# Patient Record
Sex: Male | Born: 1990 | Race: White | Hispanic: No | Marital: Single | Smoking: Never smoker
Health system: Southern US, Community
[De-identification: ages and names within clinical notes are randomized; demographics above are authoritative.]

## PROBLEM LIST (undated history)

## (undated) DIAGNOSIS — S62009A Unspecified fracture of navicular [scaphoid] bone of unspecified wrist, initial encounter for closed fracture: Secondary | ICD-10-CM

## (undated) DIAGNOSIS — T07XXXA Unspecified multiple injuries, initial encounter: Secondary | ICD-10-CM

---
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
## 2009-04-13 ENCOUNTER — Ambulatory Visit (HOSPITAL_BASED_OUTPATIENT_CLINIC_OR_DEPARTMENT_OTHER): Admission: RE | Admit: 2009-04-13 | Discharge: 2009-04-13 | Payer: Self-pay | Admitting: Orthopedic Surgery

## 2009-04-13 HISTORY — PX: ORIF SCAPHOID FRACTURE: SHX2130

## 2010-09-29 LAB — POCT HEMOGLOBIN-HEMACUE: Hemoglobin: 12.6 g/dL — ABNORMAL LOW (ref 13.0–17.0)

## 2010-12-30 ENCOUNTER — Encounter: Payer: Self-pay | Admitting: Internal Medicine

## 2010-12-30 ENCOUNTER — Ambulatory Visit (INDEPENDENT_AMBULATORY_CARE_PROVIDER_SITE_OTHER): Payer: BC Managed Care – PPO | Admitting: Internal Medicine

## 2010-12-30 DIAGNOSIS — F329 Major depressive disorder, single episode, unspecified: Secondary | ICD-10-CM

## 2010-12-30 DIAGNOSIS — G4700 Insomnia, unspecified: Secondary | ICD-10-CM

## 2010-12-30 DIAGNOSIS — R3989 Other symptoms and signs involving the genitourinary system: Secondary | ICD-10-CM

## 2010-12-30 DIAGNOSIS — F3289 Other specified depressive episodes: Secondary | ICD-10-CM

## 2010-12-30 DIAGNOSIS — F32A Depression, unspecified: Secondary | ICD-10-CM

## 2010-12-30 DIAGNOSIS — R5383 Other fatigue: Secondary | ICD-10-CM

## 2010-12-30 DIAGNOSIS — R5381 Other malaise: Secondary | ICD-10-CM

## 2010-12-30 DIAGNOSIS — R39198 Other difficulties with micturition: Secondary | ICD-10-CM

## 2010-12-30 LAB — POCT URINALYSIS DIPSTICK
Bilirubin, UA: NEGATIVE
Blood, UA: NEGATIVE
Glucose, UA: NEGATIVE
Ketones, UA: NEGATIVE
Leukocytes, UA: NEGATIVE
Nitrite, UA: NEGATIVE
Protein, UA: NEGATIVE
Spec Grav, UA: 1.005
Urobilinogen, UA: 0.2
pH, UA: 1.015

## 2010-12-30 LAB — CBC WITH DIFFERENTIAL/PLATELET
Basophils Absolute: 0 10*3/uL (ref 0.0–0.1)
Basophils Relative: 0.7 % (ref 0.0–3.0)
Eosinophils Absolute: 0.1 10*3/uL (ref 0.0–0.7)
Eosinophils Relative: 2 % (ref 0.0–5.0)
HCT: 44.7 % (ref 39.0–52.0)
Hemoglobin: 15.2 g/dL (ref 13.0–17.0)
Lymphocytes Relative: 29.8 % (ref 12.0–46.0)
Lymphs Abs: 1.8 10*3/uL (ref 0.7–4.0)
MCHC: 34.1 g/dL (ref 30.0–36.0)
MCV: 92.4 fl (ref 78.0–100.0)
Monocytes Absolute: 0.6 10*3/uL (ref 0.1–1.0)
Monocytes Relative: 9.8 % (ref 3.0–12.0)
Neutro Abs: 3.5 10*3/uL (ref 1.4–7.7)
Neutrophils Relative %: 57.7 % (ref 43.0–77.0)
Platelets: 176 10*3/uL (ref 150.0–400.0)
RBC: 4.83 Mil/uL (ref 4.22–5.81)
RDW: 12.3 % (ref 11.5–14.6)
WBC: 6.1 10*3/uL (ref 4.5–10.5)

## 2010-12-30 LAB — COMPREHENSIVE METABOLIC PANEL
ALT: 24 U/L (ref 0–53)
AST: 20 U/L (ref 0–37)
Albumin: 4.8 g/dL (ref 3.5–5.2)
Alkaline Phosphatase: 55 U/L (ref 39–117)
BUN: 9 mg/dL (ref 6–23)
CO2: 33 mEq/L — ABNORMAL HIGH (ref 19–32)
Calcium: 9.5 mg/dL (ref 8.4–10.5)
Chloride: 103 mEq/L (ref 96–112)
Creatinine, Ser: 0.9 mg/dL (ref 0.4–1.5)
GFR: 106.68 mL/min (ref >60.00)
Glucose, Bld: 81 mg/dL (ref 70–99)
Potassium: 3.8 mEq/L (ref 3.5–5.1)
Sodium: 142 mEq/L (ref 135–145)
Total Bilirubin: 0.9 mg/dL (ref 0.3–1.2)
Total Protein: 7.2 g/dL (ref 6.0–8.3)

## 2010-12-30 LAB — TSH: TSH: 1.71 u[IU]/mL (ref 0.35–5.50)

## 2010-12-30 MED ORDER — ZOLPIDEM TARTRATE 5 MG PO TABS: 5.0000 mg | ORAL_TABLET | Freq: Every evening | ORAL | Status: DC | PRN

## 2010-12-30 NOTE — Assessment & Plan Note (Addendum)
Problems w/ sleep patern as described above: rec to avoid naps Needs to go to sleep at the same time every night List of tips of healthy sleep habits provided Small dose ambien 5mg  qhs , #30, no Rf to be use as needed if can't fall sleep or if wakes up and can't fall sleep again; s/e discussed, printed material from UTD provided; knows effects will last ~ 6 to 7 hours  Patient to call if problems or if he is not improving

## 2010-12-30 NOTE — Assessment & Plan Note (Addendum)
History of depression, diagnosed approximately 2009, took  medications for a year, off medicines for more than one year. Currently asymptomatic, see history of present illness.

## 2010-12-30 NOTE — Assessment & Plan Note (Signed)
Chronic issue (years) Udip neg rec formal urologic eval (pt will discuss w/ his father who is anesthesiologist here in town)

## 2010-12-30 NOTE — Progress Notes (Signed)
  Subjective:    Patient ID: Don [name], male    DOB: Feb 11, 1991, 20 y.o.   MRN: [id number]  HPI New patient His chief complaint is difficulty with sleeping. During the summer he goes to school Monday through Thursday and works mostly on the weekends. Works  from 6 AM to 7 PM, he comes back home very tired, occasionally takes a nap, then he is unable to sleep the rest of the night. He also has a history of depression, off medications for about 1 to 1.5  years, denies to me any feeling of sadness or depression per se. He does feel fatigued and tired, lost his desire  to exercise. He thinks fatigue is related from the lack of a good sleep pattern  Past Medical History  Diagnosis Date  . Depression     Past Surgical History  Procedure Date  . Scaphoid fracture surgery     R    Family History  Problem Relation Age of Onset  . Alcohol abuse Brother   . Colon cancer      MGGF  . Stroke Maternal Grandmother   . Stroke Maternal Grandfather   . Hypertension    . Depression      PGM,aunts,uncles  . Diabetes      cousin    History   Social History  . Marital Status: Single    Spouse Name: N/A    Number of Children: N/A  . Years of Education: N/A   Occupational History  . student and work at [employer]    Social History Main Topics  . Smoking status: Never Smoker   . Smokeless tobacco: Not on file  . Alcohol Use: No  . Drug Use: No     nothing in years   . Sexually Active: Not on file   Other Topics Concern  . Not on file   Social History Narrative   Lives w/ parents-- goes to [employer] college     Review of Systems Denies snoring No nausea, vomiting diarrhea No ED issues or decreased libido Denies suicidal ideas or feeling unmotivated (except for exercise). For years, since  age 23 or 21, has noted occasional difficulty with urination. His urine is described as dilute I. he is thirsty a lot. No recent change in his weight.     Objective:   Physical Exam    Constitutional: He is oriented to person, place, and time. He appears well-developed and well-nourished. No distress.  HENT:  Head: Normocephalic and atraumatic.  Neck: No thyromegaly present.  Cardiovascular: Normal rate, regular rhythm and normal heart sounds.   No murmur heard. Pulmonary/Chest: Effort normal and breath sounds normal. No respiratory distress. He has no wheezes. He has no rales.  Abdominal: Soft. Bowel sounds are normal. He exhibits no distension. There is no tenderness.  Musculoskeletal: He exhibits no edema.  Neurological: He is alert and oriented to person, place, and time.  Skin: He is not diaphoretic.  Psychiatric: He has a normal mood and affect. His behavior is normal. Judgment and thought content normal.          Assessment & Plan:

## 2010-12-30 NOTE — Assessment & Plan Note (Signed)
Suspect he will feel better once sleep patern improves Basic labs to r/o problems like anemia DM

## 2011-01-03 ENCOUNTER — Telehealth: Payer: Self-pay | Admitting: Internal Medicine

## 2011-01-04 ENCOUNTER — Telehealth: Payer: Self-pay | Admitting: *Deleted

## 2011-01-04 NOTE — Telephone Encounter (Signed)
Message copied by Leanne Lovely on Wed Jan 04, 2011 11:49 AM ------      Message from: Wanda Plump      Created: Tue Jan 03, 2011  5:09 PM       Advise patient:      Labs wnl

## 2011-01-04 NOTE — Telephone Encounter (Signed)
Message left for patient to return my call on cell, no answer on home phone.

## 2011-01-05 NOTE — Telephone Encounter (Signed)
Pt is aware.  

## 2011-01-08 NOTE — Telephone Encounter (Signed)
error 

## 2011-06-12 ENCOUNTER — Emergency Department (HOSPITAL_COMMUNITY)
Admission: EM | Admit: 2011-06-12 | Discharge: 2011-06-13 | Disposition: A | Discharge status: Home / Self Care | Payer: BC Managed Care – PPO | Attending: Emergency Medicine | Admitting: Emergency Medicine

## 2011-06-12 ENCOUNTER — Encounter (HOSPITAL_COMMUNITY): Payer: Self-pay | Admitting: Emergency Medicine

## 2011-06-12 DIAGNOSIS — IMO0001 Reserved for inherently not codable concepts without codable children: Secondary | ICD-10-CM | POA: Insufficient documentation

## 2011-06-12 DIAGNOSIS — IMO0002 Reserved for concepts with insufficient information to code with codable children: Secondary | ICD-10-CM | POA: Insufficient documentation

## 2011-06-12 DIAGNOSIS — Z79899 Other long term (current) drug therapy: Secondary | ICD-10-CM | POA: Insufficient documentation

## 2011-06-12 MED ORDER — AMOXICILLIN-POT CLAVULANATE 875-125 MG PO TABS: 1.0000 | ORAL_TABLET | Freq: Two times a day (BID) | ORAL | Status: AC

## 2011-06-12 MED ORDER — AMOXICILLIN-POT CLAVULANATE 875-125 MG PO TABS
1.0000 | ORAL_TABLET | ORAL | Status: AC
  Administered 2011-06-13: 1 via ORAL
  Filled 2011-06-12: qty 1

## 2011-06-12 MED ORDER — AMOXICILLIN-POT CLAVULANATE 875-125 MG PO TABS: 1.0000 | ORAL_TABLET | Freq: Once | ORAL | Status: DC

## 2011-06-12 MED ORDER — TETANUS-DIPHTH-ACELL PERTUSSIS 5-2.5-18.5 LF-MCG/0.5 IM SUSP
0.5000 mL | Freq: Once | INTRAMUSCULAR | Status: AC
  Administered 2011-06-13: 0.5 mL via INTRAMUSCULAR
  Filled 2011-06-12 (×2): qty 0.5
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
## 2011-06-12 NOTE — ED Notes (Signed)
Pt c/o cat bite to left 2nd digit; pt sts was petting cat and bit him; small scratch noted; pt sts unknown vaccine hx of cat

## 2011-06-12 NOTE — ED Notes (Signed)
Patient states he was playing with neighborhood cat. States the cat didn't break skin just scratched him. Concerned with infection

## 2011-06-13 NOTE — ED Provider Notes (Signed)
Medical screening examination/treatment/procedure(s) were performed by non-physician practitioner and as supervising physician I was immediately available for consultation/collaboration.  Raeford Razor, MD 06/13/11 2227

## 2011-10-27 ENCOUNTER — Encounter (HOSPITAL_COMMUNITY): Payer: Self-pay

## 2011-10-27 ENCOUNTER — Emergency Department (INDEPENDENT_AMBULATORY_CARE_PROVIDER_SITE_OTHER)
Admission: EM | Admit: 2011-10-27 | Discharge: 2011-10-27 | Disposition: A | Discharge status: Home / Self Care | Payer: BC Managed Care – PPO | Source: Home / Self Care | Attending: Emergency Medicine | Admitting: Emergency Medicine

## 2011-10-27 DIAGNOSIS — X19XXXA Contact with other heat and hot substances, initial encounter: Secondary | ICD-10-CM

## 2011-10-27 DIAGNOSIS — T23209A Burn of second degree of unspecified hand, unspecified site, initial encounter: Secondary | ICD-10-CM

## 2011-10-27 DIAGNOSIS — T23201A Burn of second degree of right hand, unspecified site, initial encounter: Secondary | ICD-10-CM

## 2011-10-27 MED ORDER — OXYCODONE-ACETAMINOPHEN 5-325 MG PO TABS: ORAL_TABLET | ORAL | Status: AC

## 2011-10-27 MED ORDER — SILVER SULFADIAZINE 1 % EX CREA: TOPICAL_CREAM | Freq: Every day | CUTANEOUS | Status: DC

## 2011-10-27 NOTE — Discharge Instructions (Signed)
Change dressing daily, wash with soap and water, pat dry, apply Silvadene, Telfa dressing, and some roll gauze.  Return in 3 days for recheck.
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
## 2011-10-31 ENCOUNTER — Emergency Department (INDEPENDENT_AMBULATORY_CARE_PROVIDER_SITE_OTHER)
Admission: EM | Admit: 2011-10-31 | Discharge: 2011-10-31 | Disposition: A | Discharge status: Home / Self Care | Payer: BC Managed Care – PPO | Source: Home / Self Care | Attending: Family Medicine | Admitting: Family Medicine

## 2011-10-31 ENCOUNTER — Encounter (HOSPITAL_COMMUNITY): Payer: Self-pay | Admitting: *Deleted

## 2011-10-31 DIAGNOSIS — T23209A Burn of second degree of unspecified hand, unspecified site, initial encounter: Secondary | ICD-10-CM

## 2011-10-31 DIAGNOSIS — X19XXXA Contact with other heat and hot substances, initial encounter: Secondary | ICD-10-CM

## 2011-10-31 DIAGNOSIS — Z09 Encounter for follow-up examination after completed treatment for conditions other than malignant neoplasm: Secondary | ICD-10-CM
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
## 2011-10-31 NOTE — Discharge Instructions (Signed)
Care as discussed, wash regularly, use cream until dry, return as needed.
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
## 2012-02-06 ENCOUNTER — Emergency Department (INDEPENDENT_AMBULATORY_CARE_PROVIDER_SITE_OTHER)
Admission: EM | Admit: 2012-02-06 | Discharge: 2012-02-06 | Disposition: A | Discharge status: Home / Self Care | Payer: BC Managed Care – PPO | Source: Home / Self Care | Attending: Emergency Medicine | Admitting: Emergency Medicine

## 2012-02-06 ENCOUNTER — Telehealth: Payer: Self-pay | Admitting: Internal Medicine

## 2012-02-06 ENCOUNTER — Encounter (HOSPITAL_COMMUNITY): Payer: Self-pay | Admitting: *Deleted

## 2012-02-06 DIAGNOSIS — A779 Spotted fever, unspecified: Secondary | ICD-10-CM

## 2012-02-06 DIAGNOSIS — A770 Spotted fever due to Rickettsia rickettsii: Secondary | ICD-10-CM

## 2012-02-06 LAB — POCT I-STAT, CHEM 8
BUN: 11 mg/dL (ref 6–23)
Calcium, Ion: 1.2 mmol/L (ref 1.12–1.23)
Creatinine, Ser: 1.2 mg/dL (ref 0.50–1.35)
TCO2: 23 mmol/L (ref 0–100)

## 2012-02-06 LAB — CBC WITH DIFFERENTIAL/PLATELET
Eosinophils Relative: 0 % (ref 0–5)
HCT: 45 % (ref 39.0–52.0)
Hemoglobin: 16.2 g/dL (ref 13.0–17.0)
Lymphocytes Relative: 10 % — ABNORMAL LOW (ref 12–46)
Lymphs Abs: 0.8 10*3/uL (ref 0.7–4.0)
MCV: 85.6 fL (ref 78.0–100.0)
Monocytes Absolute: 0.9 10*3/uL (ref 0.1–1.0)
Monocytes Relative: 12 % (ref 3–12)
RBC: 5.26 MIL/uL (ref 4.22–5.81)
WBC: 7.7 10*3/uL (ref 4.0–10.5)

## 2012-02-06 MED ORDER — DOXYCYCLINE HYCLATE 100 MG PO TABS: 100.0000 mg | ORAL_TABLET | Freq: Two times a day (BID) | ORAL | Status: AC
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
## 2012-02-06 NOTE — Telephone Encounter (Signed)
Check on pt in am.

## 2012-02-06 NOTE — ED Provider Notes (Signed)
Chief Complaint  Patient presents with  . Fever    History of Present Illness:   Don [name] is a 21 year old male who was bitten by a tick on the left ankle on Monday, August 5, 9 days ago. He was in [place] when the tick bite occurred. There was a halo of erythema around the tick bite for about 3 days. The tick was a normal-size  wood tick. He did well up until last night when he felt hot and noted muscle aches. This morning he had a headache, a temperature of 100.3 and felt nauseated. He denies any muscle aching today. He's had no other symptoms including generalized skin rash, nasal congestion, rhinorrhea, sore throat, stiff neck, adenopathy, coughing, chest pain, shortness of breath, abdominal pain, vomiting, diarrhea, or urinary symptoms.  Review of Systems:  Other than noted above, the patient denies any of the following symptoms. Systemic:  No fever, chills, sweats, fatigue, myalgias, headache, or anorexia. Eye:  No redness, pain or drainage. ENT:  No earache, nasal congestion, rhinorrhea, sinus pressure, or sore throat. Lungs:  No cough, sputum production, wheezing, shortness of breath.  Cardiovascular:  No chest pain, palpitations, or syncope. GI:  No nausea, vomiting, abdominal pain or diarrhea. GU:  No dysuria, frequency, or hematuria. Skin:  No rash or pruritis.  PMFSH:  Past medical history, family history, social history, meds, and allergies were reviewed.   Physical Exam:   Vital signs:  BP 116/75  Pulse 79  Temp 97.9 F (36.6 C) (Oral)  Resp 16  SpO2 99% General:  Alert, in no distress. Eye:  PERRL, full EOMs.  Lids and conjunctivas were normal. ENT:  TMs and canals were normal, without erythema or inflammation.  Nasal mucosa was clear and uncongested, without drainage.  Mucous membranes were moist.  Pharynx was clear, without exudate or drainage.  There were no oral ulcerations or lesions. Neck:  Supple, no adenopathy, tenderness or mass. Thyroid was normal. Lungs:  No  respiratory distress.  Lungs were clear to auscultation, without wheezes, rales or rhonchi.  Breath sounds were clear and equal bilaterally. Heart:  Regular rhythm, without gallops, murmers or rubs. Abdomen:  Soft, flat, and non-tender to palpation.  No hepatosplenomagaly or mass. Skin:  Clear, warm, and dry, without rash or lesions. The area of the tick bite right now is just a tiny scabbed over area measuring no more than a millimeter in size with no surrounding erythema or tenderness. He has no rash on his wrists or ankles.  Labs:   Results for orders placed during the hospital encounter of 02/06/12  CBC WITH DIFFERENTIAL      Component Value Range   WBC 7.7  4.0 - 10.5 K/uL   RBC 5.26  4.22 - 5.81 MIL/uL   Hemoglobin 16.2  13.0 - 17.0 g/dL   HCT 16.1  09.6 - 04.5 %   MCV 85.6  78.0 - 100.0 fL   MCH 30.8  26.0 - 34.0 pg   MCHC 36.0  30.0 - 36.0 g/dL   RDW 40.9 (*) 81.1 - 91.4 %   Platelets 152  150 - 400 K/uL   Neutrophils Relative 77  43 - 77 %   Neutro Abs 6.0  1.7 - 7.7 K/uL   Lymphocytes Relative 10 (*) 12 - 46 %   Lymphs Abs 0.8  0.7 - 4.0 K/uL   Monocytes Relative 12  3 - 12 %   Monocytes Absolute 0.9  0.1 - 1.0 K/uL   Eosinophils  Relative 0  0 - 5 %   Eosinophils Absolute 0.0  0.0 - 0.7 K/uL   Basophils Relative 1  0 - 1 %   Basophils Absolute 0.0  0.0 - 0.1 K/uL  POCT I-STAT, CHEM 8      Component Value Range   Sodium 141  135 - 145 mEq/L   Potassium 3.6  3.5 - 5.1 mEq/L   Chloride 104  96 - 112 mEq/L   BUN 11  6 - 23 mg/dL   Creatinine, Ser 1.61  0.50 - 1.35 mg/dL   Glucose, Bld 97  70 - 99 mg/dL   Calcium, Ion 0.96  1.12 - 1.23 mmol/L   TCO2 23  0 - 100 mmol/L   Hemoglobin 16.3  13.0 - 17.0 g/dL   HCT 04.5  40.9 - 81.1 %    Other Labs Obtained at Urgent Care Center:  [facility] spotted fever IgM antibody was obtained.  Results are pending at this time and we will call about any positive results.  Assessment:  The encounter diagnosis was [facility] spotted  fever.  Plan:   1.  The following meds were prescribed:   New Prescriptions   DOXYCYCLINE (VIBRA-TABS) 100 MG TABLET    Take 1 tablet (100 mg total) by mouth 2 (two) times daily.   2.  The patient was instructed in symptomatic care and handouts were given. 3.  The patient was told to return if becoming worse in any way, if no better in 3 or 4 days, and given some red flag symptoms that would indicate earlier return.  Follow up:  The patient was told to follow up here in 48 hours for recheck. He was told to go directly to the emergency room he should have trouble keeping the antibiotic down the stomach or if he feels like he's getting worse.     Reuben Likes, MD 02/06/12 2032

## 2012-02-06 NOTE — ED Notes (Signed)
Pt reports tick bite one week ago on lower left ankle that was red and had circular reaction around it. Swelling and redness has since went down. Pt reports waking this morning with fever, headache and nausea.

## 2012-02-07 ENCOUNTER — Telehealth (HOSPITAL_COMMUNITY): Payer: Self-pay | Admitting: *Deleted

## 2012-02-07 NOTE — Telephone Encounter (Signed)
Patient was seen in UC yesterday.    KP

## 2012-02-07 NOTE — ED Notes (Signed)
Lab called to report critical RMSF value.  IGM - 1.38.  Will notify RN and MD

## 2012-02-07 NOTE — Telephone Encounter (Signed)
Great!

## 2012-02-07 NOTE — ED Notes (Signed)
Lab called with RMSF IgM result 1.38 H.  Lab shown to Dr. Ladon Applebaum.  He said to call for clinical improvement, tell to pt. finish the antibiotics, if worsening go to the ED. I called pt. Pt. verified x 2 and given results. Pt. told he has RMSF.  Pt. said he is feeling better today, still has H/A, nausea, no appetite, and feels tired.  Took a nap today.  Pt. Given Dr. Rigoberto Noel instructions and voiced understanding. He said he was told to come back tomorrow for a recheck. I told him that he should still do that. Vassie Moselle 02/07/2012

## 2012-02-08 NOTE — ED Notes (Signed)
Dr. Lorenz Coaster notified of results. I told him that I had talked to Don [name]. yesterday and what Don [name]. said. I said, Don [name]. was told to come back today for a recheck.

## 2012-02-28 ENCOUNTER — Telehealth: Payer: Self-pay | Admitting: *Deleted

## 2012-02-28 NOTE — Telephone Encounter (Signed)
Left msg for pt to call off to schedule a follow-up appt for [facility] spotted fever with Dr. Drue Novel.

## 2012-03-07 ENCOUNTER — Encounter (HOSPITAL_COMMUNITY): Payer: Self-pay | Admitting: *Deleted

## 2012-03-07 ENCOUNTER — Emergency Department (HOSPITAL_COMMUNITY)
Admission: EM | Admit: 2012-03-07 | Discharge: 2012-03-08 | Disposition: A | Discharge status: Home / Self Care | Payer: BC Managed Care – PPO | Attending: Emergency Medicine | Admitting: Emergency Medicine

## 2012-03-07 ENCOUNTER — Emergency Department (HOSPITAL_COMMUNITY): Payer: BC Managed Care – PPO

## 2012-03-07 DIAGNOSIS — IMO0002 Reserved for concepts with insufficient information to code with codable children: Secondary | ICD-10-CM | POA: Insufficient documentation

## 2012-03-07 DIAGNOSIS — S060XAA Concussion with loss of consciousness status unknown, initial encounter: Secondary | ICD-10-CM | POA: Insufficient documentation

## 2012-03-07 DIAGNOSIS — H5789 Other specified disorders of eye and adnexa: Secondary | ICD-10-CM | POA: Insufficient documentation

## 2012-03-07 DIAGNOSIS — T07XXXA Unspecified multiple injuries, initial encounter: Secondary | ICD-10-CM

## 2012-03-07 DIAGNOSIS — S060X9A Concussion with loss of consciousness of unspecified duration, initial encounter: Secondary | ICD-10-CM | POA: Insufficient documentation

## 2012-03-07 MED ORDER — IBUPROFEN 800 MG PO TABS
800.0000 mg | ORAL_TABLET | Freq: Once | ORAL | Status: AC
  Administered 2012-03-07: 800 mg via ORAL
  Filled 2012-03-07: qty 1

## 2012-03-07 MED ORDER — TRAMADOL HCL 50 MG PO TABS: 50.0000 mg | ORAL_TABLET | Freq: Four times a day (QID) | ORAL | Status: AC | PRN

## 2012-03-07 NOTE — ED Notes (Signed)
PA at bedside.

## 2012-03-07 NOTE — ED Notes (Signed)
Returned from CT.

## 2012-03-07 NOTE — ED Notes (Signed)
Pt ambulatory to [facility]
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
## 2012-03-08 DIAGNOSIS — S62009A Unspecified fracture of navicular [scaphoid] bone of unspecified wrist, initial encounter for closed fracture: Secondary | ICD-10-CM

## 2012-03-08 DIAGNOSIS — T07XXXA Unspecified multiple injuries, initial encounter: Secondary | ICD-10-CM

## 2012-03-08 HISTORY — DX: Unspecified multiple injuries, initial encounter: T07.XXXA

## 2012-03-08 HISTORY — DX: Unspecified fracture of navicular (scaphoid) bone of unspecified wrist, initial encounter for closed fracture: S62.009A

## 2012-03-13 ENCOUNTER — Ambulatory Visit (INDEPENDENT_AMBULATORY_CARE_PROVIDER_SITE_OTHER)
Admission: RE | Admit: 2012-03-13 | Discharge: 2012-03-13 | Disposition: A | Discharge status: Home / Self Care | Payer: BC Managed Care – PPO | Source: Ambulatory Visit | Attending: Internal Medicine | Admitting: Internal Medicine

## 2012-03-13 ENCOUNTER — Encounter: Payer: Self-pay | Admitting: Internal Medicine

## 2012-03-13 ENCOUNTER — Encounter (HOSPITAL_COMMUNITY): Payer: Self-pay | Admitting: Anesthesiology

## 2012-03-13 ENCOUNTER — Ambulatory Visit (INDEPENDENT_AMBULATORY_CARE_PROVIDER_SITE_OTHER): Payer: BC Managed Care – PPO | Admitting: Internal Medicine

## 2012-03-13 VITALS — BP 108/72 | HR 55 | Temp 97.5°F | Wt 163.0 lb

## 2012-03-13 DIAGNOSIS — M25539 Pain in unspecified wrist: Secondary | ICD-10-CM

## 2012-03-13 DIAGNOSIS — S62109A Fracture of unspecified carpal bone, unspecified wrist, initial encounter for closed fracture: Secondary | ICD-10-CM

## 2012-03-13 DIAGNOSIS — S060X9A Concussion with loss of consciousness of unspecified duration, initial encounter: Secondary | ICD-10-CM

## 2012-03-13 NOTE — ED Provider Notes (Signed)
Medical screening examination/treatment/procedure(s) were performed by non-physician practitioner and as supervising physician I was immediately available for consultation/collaboration.  Tracen Mahler, MD 03/13/12 0027 

## 2012-03-13 NOTE — Patient Instructions (Addendum)
Please get your x-ray at the other [facility]  office located at: [address] [place], across from [facility].  Please go to the basement, this is a walk-in facility, they are open from 8:30 to 5:30 PM. Phone number [phone]. ---- Call if the wrist pain continue beyond 2 weeks Call anytime if you develop a headache, neck pain, nausea or vomiting.
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
## 2012-03-14 ENCOUNTER — Telehealth: Payer: Self-pay | Admitting: Internal Medicine

## 2012-03-14 DIAGNOSIS — S62109A Fracture of unspecified carpal bone, unspecified wrist, initial encounter for closed fracture: Secondary | ICD-10-CM

## 2012-03-14 NOTE — Telephone Encounter (Signed)
Discussed with pt, entered referral.  

## 2012-03-14 NOTE — Telephone Encounter (Signed)
Pt left vm on triage line requesting the results from the xray on his wrist. Call back # [phone]

## 2012-03-21 ENCOUNTER — Encounter (HOSPITAL_BASED_OUTPATIENT_CLINIC_OR_DEPARTMENT_OTHER): Payer: Self-pay | Admitting: *Deleted

## 2012-03-22 ENCOUNTER — Other Ambulatory Visit: Payer: Self-pay | Admitting: Orthopedic Surgery

## 2012-03-26 ENCOUNTER — Encounter (HOSPITAL_BASED_OUTPATIENT_CLINIC_OR_DEPARTMENT_OTHER): Payer: Self-pay | Admitting: Orthopedic Surgery

## 2012-03-26 ENCOUNTER — Ambulatory Visit (HOSPITAL_BASED_OUTPATIENT_CLINIC_OR_DEPARTMENT_OTHER)
Admission: RE | Admit: 2012-03-26 | Discharge: 2012-03-26 | Disposition: A | Discharge status: Home / Self Care | Payer: BC Managed Care – PPO | Source: Ambulatory Visit | Attending: Orthopedic Surgery | Admitting: Orthopedic Surgery

## 2012-03-26 ENCOUNTER — Encounter (HOSPITAL_BASED_OUTPATIENT_CLINIC_OR_DEPARTMENT_OTHER): Payer: Self-pay | Admitting: Anesthesiology

## 2012-03-26 ENCOUNTER — Encounter (HOSPITAL_BASED_OUTPATIENT_CLINIC_OR_DEPARTMENT_OTHER)
Admission: RE | Disposition: A | Discharge status: Home / Self Care | Payer: Self-pay | Source: Ambulatory Visit | Attending: Orthopedic Surgery

## 2012-03-26 ENCOUNTER — Ambulatory Visit (HOSPITAL_BASED_OUTPATIENT_CLINIC_OR_DEPARTMENT_OTHER): Payer: BC Managed Care – PPO | Admitting: Anesthesiology

## 2012-03-26 DIAGNOSIS — S62009A Unspecified fracture of navicular [scaphoid] bone of unspecified wrist, initial encounter for closed fracture: Secondary | ICD-10-CM | POA: Insufficient documentation

## 2012-03-26 HISTORY — DX: Unspecified fracture of navicular (scaphoid) bone of unspecified wrist, initial encounter for closed fracture: S62.009A

## 2012-03-26 HISTORY — DX: Unspecified multiple injuries, initial encounter: T07.XXXA

## 2012-03-26 HISTORY — PX: ORIF SCAPHOID FRACTURE: SHX2130

## 2012-03-26 LAB — POCT HEMOGLOBIN-HEMACUE: Hemoglobin: 14 g/dL (ref 13.0–17.0)

## 2012-03-26 SURGERY — OPEN REDUCTION INTERNAL FIXATION (ORIF) SCAPHOID FRACTURE
Anesthesia: General | Site: Hand | Laterality: Left | Wound class: Clean

## 2012-03-26 MED ORDER — ONDANSETRON HCL 4 MG/2ML IJ SOLN: 4.0000 mg | Freq: Four times a day (QID) | INTRAMUSCULAR | Status: DC | PRN

## 2012-03-26 MED ORDER — CEFAZOLIN SODIUM-DEXTROSE 2-3 GM-% IV SOLR
2.0000 g | INTRAVENOUS | Status: AC
  Administered 2012-03-26: 2 g via INTRAVENOUS

## 2012-03-26 MED ORDER — LACTATED RINGERS IV SOLN
INTRAVENOUS | Status: DC | PRN
  Administered 2012-03-26 (×2): via INTRAVENOUS

## 2012-03-26 MED ORDER — MIDAZOLAM HCL 2 MG/2ML IJ SOLN: 1.0000 mg | INTRAMUSCULAR | Status: DC | PRN

## 2012-03-26 MED ORDER — FENTANYL CITRATE 0.05 MG/ML IJ SOLN
INTRAMUSCULAR | Status: DC | PRN
  Administered 2012-03-26 (×2): 50 ug via INTRAVENOUS

## 2012-03-26 MED ORDER — CHLORHEXIDINE GLUCONATE 4 % EX LIQD: 60.0000 mL | Freq: Once | CUTANEOUS | Status: DC

## 2012-03-26 MED ORDER — LACTATED RINGERS IV SOLN
INTRAVENOUS | Status: DC
  Administered 2012-03-26: 10:00:00 via INTRAVENOUS

## 2012-03-26 MED ORDER — HYDROMORPHONE HCL PF 1 MG/ML IJ SOLN
0.2500 mg | INTRAMUSCULAR | Status: DC | PRN
  Administered 2012-03-26 (×3): 0.5 mg via INTRAVENOUS

## 2012-03-26 MED ORDER — ONDANSETRON HCL 4 MG/2ML IJ SOLN
INTRAMUSCULAR | Status: DC | PRN
  Administered 2012-03-26: 4 mg via INTRAVENOUS

## 2012-03-26 MED ORDER — DEXAMETHASONE SODIUM PHOSPHATE 4 MG/ML IJ SOLN
INTRAMUSCULAR | Status: DC | PRN
  Administered 2012-03-26: 10 mg via INTRAVENOUS

## 2012-03-26 MED ORDER — BUPIVACAINE HCL (PF) 0.25 % IJ SOLN
INTRAMUSCULAR | Status: DC | PRN
  Administered 2012-03-26: 2 mL

## 2012-03-26 MED ORDER — FENTANYL CITRATE 0.05 MG/ML IJ SOLN: 50.0000 ug | INTRAMUSCULAR | Status: DC | PRN

## 2012-03-26 MED ORDER — OXYCODONE-ACETAMINOPHEN 7.5-325 MG PO TABS: 1.0000 | ORAL_TABLET | ORAL | Status: DC | PRN

## 2012-03-26 MED ORDER — MEPERIDINE HCL 25 MG/ML IJ SOLN
6.2500 mg | INTRAMUSCULAR | Status: DC | PRN
  Administered 2012-03-26: 12.5 mg via INTRAVENOUS

## 2012-03-26 MED ORDER — MIDAZOLAM HCL 5 MG/5ML IJ SOLN
INTRAMUSCULAR | Status: DC | PRN
  Administered 2012-03-26: 2 mg via INTRAVENOUS

## 2012-03-26 MED ORDER — OXYCODONE-ACETAMINOPHEN 5-325 MG PO TABS
1.0000 | ORAL_TABLET | Freq: Once | ORAL | Status: AC | PRN
  Administered 2012-03-26: 1 via ORAL

## 2012-03-26 MED ORDER — GLYCOPYRROLATE 0.2 MG/ML IJ SOLN
INTRAMUSCULAR | Status: DC | PRN
  Administered 2012-03-26: 0.2 mg via INTRAVENOUS

## 2012-03-26 MED ORDER — PROPOFOL 10 MG/ML IV BOLUS
INTRAVENOUS | Status: DC | PRN
  Administered 2012-03-26: 30 mg via INTRAVENOUS
  Administered 2012-03-26: 250 mg via INTRAVENOUS

## 2012-03-26 SURGICAL SUPPLY — 66 items
ACCUTRAK 2 ×2 IMPLANT
BANDAGE GAUZE ELAST BULKY 4 IN (GAUZE/BANDAGES/DRESSINGS) ×2 IMPLANT
BIT DRILL ACUTRAK 2 MINI (BIT) ×1 IMPLANT
BIT DRILL MINI LNG ACUTRAK 2 (BIT) ×1 IMPLANT
BLADE MINI RND TIP GREEN BEAV (BLADE) IMPLANT
BLADE SURG 15 STRL LF DISP TIS (BLADE) ×1 IMPLANT
BLADE SURG 15 STRL SS (BLADE) ×1
BNDG COHESIVE 3X5 TAN STRL LF (GAUZE/BANDAGES/DRESSINGS) ×2 IMPLANT
BNDG ESMARK 4X9 LF (GAUZE/BANDAGES/DRESSINGS) ×2 IMPLANT
CHLORAPREP W/TINT 26ML (MISCELLANEOUS) ×2 IMPLANT
CLOTH BEACON ORANGE TIMEOUT ST (SAFETY) ×2 IMPLANT
CORDS BIPOLAR (ELECTRODE) ×2 IMPLANT
COVER MAYO STAND STRL (DRAPES) ×2 IMPLANT
COVER TABLE BACK 60X90 (DRAPES) ×2 IMPLANT
CUFF TOURNIQUET SINGLE 18IN (TOURNIQUET CUFF) ×2 IMPLANT
DECANTER SPIKE VIAL GLASS SM (MISCELLANEOUS) IMPLANT
DRAPE EXTREMITY T 121X128X90 (DRAPE) ×2 IMPLANT
DRAPE OEC MINIVIEW 54X84 (DRAPES) ×2 IMPLANT
DRAPE SURG 17X23 STRL (DRAPES) ×2 IMPLANT
DRILL ACUTRAK 2 MINI (BIT) ×2
DRILL MINI LNG ACUTRAK 2 (BIT) ×2
ELECT REM PT RETURN 9FT ADLT (ELECTROSURGICAL)
ELECTRODE REM PT RTRN 9FT ADLT (ELECTROSURGICAL) IMPLANT
GAUZE XEROFORM 1X8 LF (GAUZE/BANDAGES/DRESSINGS) ×2 IMPLANT
GLOVE BIO SURGEON STRL SZ 6.5 (GLOVE) ×2 IMPLANT
GLOVE BIO SURGEON STRL SZ7.5 (GLOVE) ×2 IMPLANT
GLOVE BIOGEL PI IND STRL 7.0 (GLOVE) ×1 IMPLANT
GLOVE BIOGEL PI IND STRL 8 (GLOVE) ×1 IMPLANT
GLOVE BIOGEL PI IND STRL 8.5 (GLOVE) ×1 IMPLANT
GLOVE BIOGEL PI INDICATOR 7.0 (GLOVE) ×1
GLOVE BIOGEL PI INDICATOR 8 (GLOVE) ×1
GLOVE BIOGEL PI INDICATOR 8.5 (GLOVE) ×1
GLOVE SURG ORTHO 8.0 STRL STRW (GLOVE) ×2 IMPLANT
GOWN BRE IMP PREV XXLGXLNG (GOWN DISPOSABLE) ×4 IMPLANT
GOWN PREVENTION PLUS XLARGE (GOWN DISPOSABLE) ×2 IMPLANT
GUIDEWIRE ORTHO MICROSHT  ACUT (WIRE) ×1
GUIDEWIRE ORTHO MICROSHT .035 (WIRE) ×1 IMPLANT
GUIDEWIRE ORTHO MINI ACTK .045 (WIRE) ×2 IMPLANT
NEEDLE 27GAX1X1/2 (NEEDLE) ×2 IMPLANT
NS IRRIG 1000ML POUR BTL (IV SOLUTION) ×2 IMPLANT
PACK BASIN DAY SURGERY FS (CUSTOM PROCEDURE TRAY) ×2 IMPLANT
PAD CAST 3X4 CTTN HI CHSV (CAST SUPPLIES) ×1 IMPLANT
PADDING CAST ABS 3INX4YD NS (CAST SUPPLIES)
PADDING CAST ABS 4INX4YD NS (CAST SUPPLIES)
PADDING CAST ABS COTTON 3X4 (CAST SUPPLIES) IMPLANT
PADDING CAST ABS COTTON 4X4 ST (CAST SUPPLIES) IMPLANT
PADDING CAST COTTON 3X4 STRL (CAST SUPPLIES) ×1
PENCIL BUTTON HOLSTER BLD 10FT (ELECTRODE) IMPLANT
SCREW ACUTRAK 2 MINI 20MM (Screw) ×2 IMPLANT
SLEEVE SCD COMPRESS KNEE MED (MISCELLANEOUS) ×2 IMPLANT
SPLINT PLASTER CAST XFAST 3X15 (CAST SUPPLIES) IMPLANT
SPLINT PLASTER XTRA FASTSET 3X (CAST SUPPLIES)
SPONGE GAUZE 4X4 12PLY (GAUZE/BANDAGES/DRESSINGS) ×2 IMPLANT
STOCKINETTE 4X48 STRL (DRAPES) ×2 IMPLANT
SUT VIC AB 0 CT1 27 (SUTURE)
SUT VIC AB 0 CT1 27XBRD ANBCTR (SUTURE) IMPLANT
SUT VIC AB 2-0 SH 27 (SUTURE)
SUT VIC AB 2-0 SH 27XBRD (SUTURE) IMPLANT
SUT VICRYL 4-0 PS2 18IN ABS (SUTURE) IMPLANT
SUT VICRYL RAPID 5 0 P 3 (SUTURE) IMPLANT
SUT VICRYL RAPIDE 4/0 PS 2 (SUTURE) ×2 IMPLANT
SYR BULB 3OZ (MISCELLANEOUS) ×2 IMPLANT
SYR CONTROL 10ML LL (SYRINGE) ×2 IMPLANT
TOWEL OR 17X24 6PK STRL BLUE (TOWEL DISPOSABLE) ×4 IMPLANT
UNDERPAD 30X30 INCONTINENT (UNDERPADS AND DIAPERS) ×2 IMPLANT
WATER STERILE IRR 1000ML POUR (IV SOLUTION) IMPLANT

## 2012-03-26 NOTE — Anesthesia Procedure Notes (Addendum)
Performed by: Gar Gibbon   Procedure Name: LMA Insertion Date/Time: 03/26/2012 10:56 AM Performed by: Gar Gibbon Pre-anesthesia Checklist: Patient identified, Emergency Drugs available, Suction available and Patient being monitored Patient Re-evaluated:Patient Re-evaluated prior to inductionOxygen Delivery Method: Circle System Utilized Preoxygenation: Pre-oxygenation with 100% oxygen Intubation Type: IV induction Ventilation: Mask ventilation without difficulty LMA: LMA inserted LMA Size: 5.0 Number of attempts: 1 Airway Equipment and Method: bite block Placement Confirmation: positive ETCO2 Tube secured with: Tape Dental Injury: Teeth and Oropharynx as per pre-operative assessment

## 2012-03-26 NOTE — Anesthesia Preprocedure Evaluation (Addendum)
Anesthesia Evaluation  Patient identified by MRN, date of birth, ID band Patient awake    Reviewed: Allergy & Precautions, H&P , NPO status , Patient's Chart, lab work & pertinent test results  Airway Mallampati: II  Neck ROM: full    Dental   Pulmonary          Cardiovascular     Neuro/Psych Depression    GI/Hepatic   Endo/Other    Renal/GU      Musculoskeletal   Abdominal   Peds  Hematology   Anesthesia Other Findings   Reproductive/Obstetrics                           Anesthesia Physical Anesthesia Plan  ASA: I  Anesthesia Plan: General   Post-op Pain Management:    Induction: Intravenous  Airway Management Planned: LMA  Additional Equipment:   Intra-op Plan:   Post-operative Plan:   Informed Consent: I have reviewed the patients History and Physical, chart, labs and discussed the procedure including the risks, benefits and alternatives for the proposed anesthesia with the patient or authorized representative who has indicated his/her understanding and acceptance.     Plan Discussed with: CRNA and Surgeon  Anesthesia Plan Comments:        Anesthesia Quick Evaluation

## 2012-03-26 NOTE — Anesthesia Postprocedure Evaluation (Signed)
Anesthesia Post Note  Patient: Don [name]  Procedure(s) Performed: Procedure(s) (LRB): OPEN REDUCTION INTERNAL FIXATION (ORIF) SCAPHOID FRACTURE (Left)  Anesthesia type: General  Patient location: PACU  Post pain: Pain level controlled and Adequate analgesia  Post assessment: Post-op Vital signs reviewed, Patient's Cardiovascular Status Stable, Respiratory Function Stable, Patent Airway and Pain level controlled  Last Vitals:  Filed Vitals:   03/26/12 1215  BP: 118/74  Pulse: 57  Temp:   Resp: 11    Post vital signs: Reviewed and stable  Level of consciousness: awake, alert  and oriented  Complications: No apparent anesthesia complications

## 2012-03-26 NOTE — Addendum Note (Signed)
Addendum  created 03/26/12 1253 by Raiford Simmonds, MD   Modules edited:Orders, PRL Based Order Sets

## 2012-03-26 NOTE — Brief Op Note (Signed)
03/26/2012  11:42 AM  PATIENT:  Don [name]  21 y.o. male  PRE-OPERATIVE DIAGNOSIS:  left scaphoid fracture  POST-OPERATIVE DIAGNOSIS:  left scaphoid fracture  PROCEDURE:  Procedure(s) (LRB) with comments: OPEN REDUCTION INTERNAL FIXATION (ORIF) SCAPHOID FRACTURE (Left)  SURGEON:  Surgeon(s) and Role:    * Nicki Reaper, MD - Primary    * Tami Ribas, MD - Assisting  PHYSICIAN ASSISTANT:   ASSISTANTS: K Yanissa Michalsky,MD   ANESTHESIA:   local and general  EBL:  Total I/O In: 1000 [I.V.:1000] Out: -   BLOOD ADMINISTERED:none  DRAINS: none   LOCAL MEDICATIONS USED:  MARCAINE     SPECIMEN:  No Specimen  DISPOSITION OF SPECIMEN:  N/A  COUNTS:  YES  TOURNIQUET:   Total Tourniquet Time Documented: Upper Arm (Left) - 25 minutes  DICTATION: .Other Dictation: Dictation Number [id number]  PLAN OF CARE: Discharge to home after PACU  PATIENT DISPOSITION:  PACU - hemodynamically stable.

## 2012-03-26 NOTE — Op Note (Signed)
Dictation Number [id number]

## 2012-03-26 NOTE — Transfer of Care (Signed)
Immediate Anesthesia Transfer of Care Note  Patient: Don [name]  Procedure(s) Performed: Procedure(s) (LRB) with comments: OPEN REDUCTION INTERNAL FIXATION (ORIF) SCAPHOID FRACTURE (Left)  Patient Location: PACU  Anesthesia Type: General  Level of Consciousness: sedated  Airway & Oxygen Therapy: Patient Spontanous Breathing and Patient connected to face mask oxygen  Post-op Assessment: Report given to PACU RN and Post -op Vital signs reviewed and stable  Post vital signs: Reviewed and stable  Complications: No apparent anesthesia complications
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
## 2012-03-27 ENCOUNTER — Encounter (HOSPITAL_BASED_OUTPATIENT_CLINIC_OR_DEPARTMENT_OTHER): Payer: Self-pay | Admitting: Orthopedic Surgery
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
## 2012-12-27 IMAGING — CR DG WRIST COMPLETE 3+V*L*
2 series · 2 of 2 positions shown · non-contrast
Comparison: None.

CLINICAL DATA: Pain post trauma LEFT WRIST - COMPLETE 3+ VIEW

[view not recorded (1 of 2)]
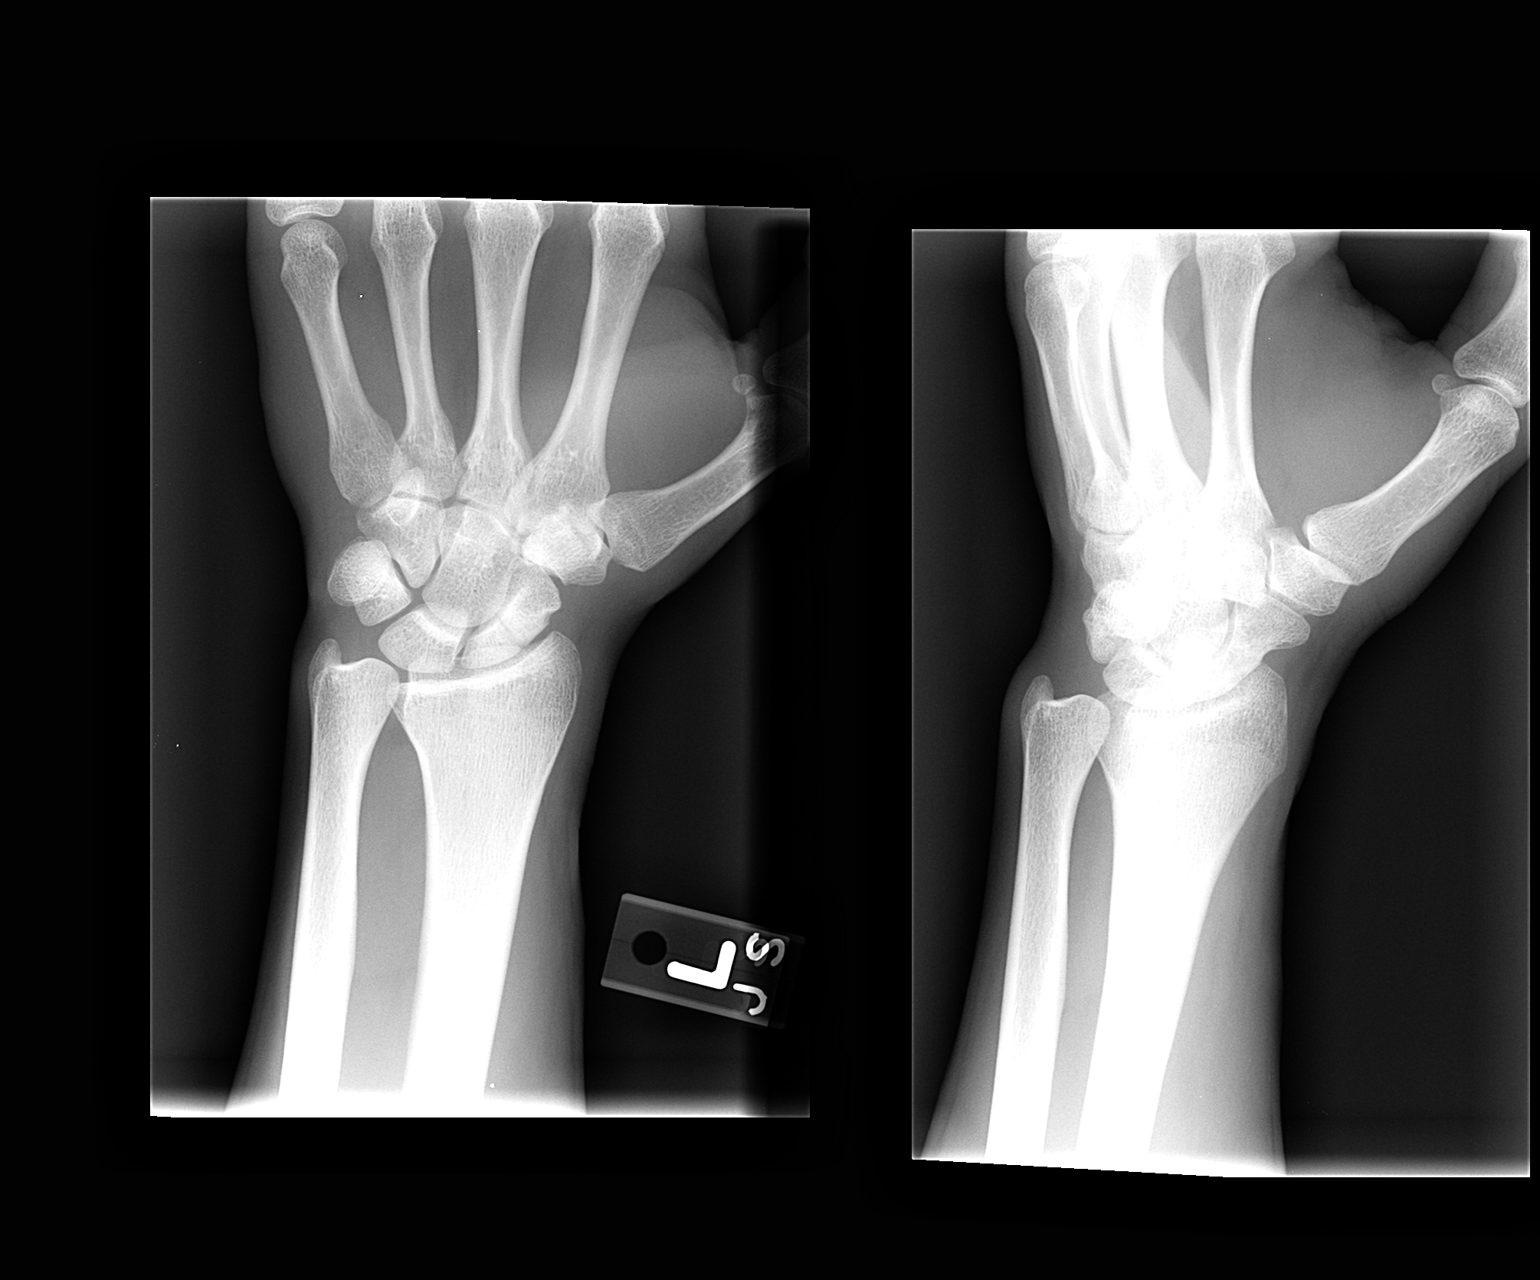

[view not recorded (2 of 2)]
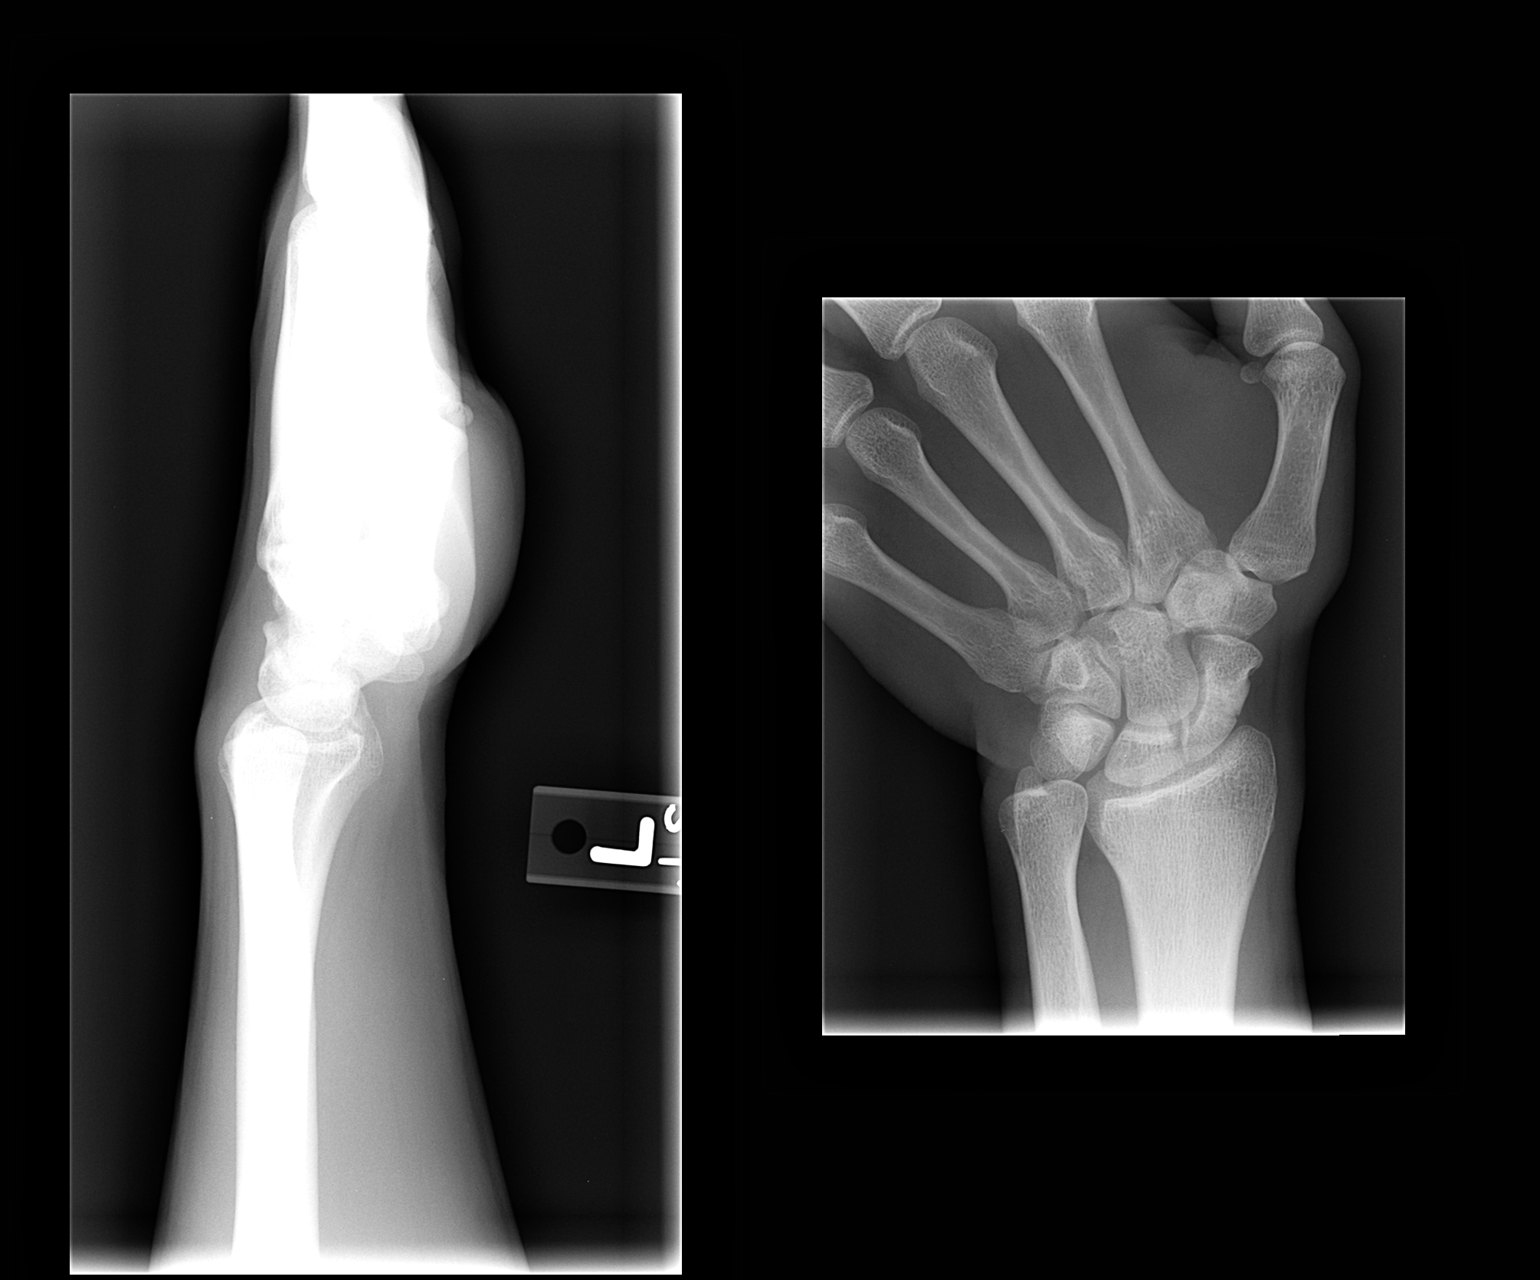

[2 of 2 positions shown; findings below may reference images not displayed]

FINDINGS: Frontal, lateral, oblique, and ulnar deviation scaphoid
images were obtained.  There is a fracture through the mid scaphoid
in anatomic alignment.  No other fractures.  No dislocation.  Joint
spaces appear intact.
IMPRESSION: Nondisplaced fracture mid scaphoid.

## 2013-01-15 ENCOUNTER — Encounter: Payer: Self-pay | Admitting: Pulmonary Disease

## 2013-01-15 ENCOUNTER — Ambulatory Visit (INDEPENDENT_AMBULATORY_CARE_PROVIDER_SITE_OTHER): Payer: BC Managed Care – PPO | Admitting: Pulmonary Disease

## 2013-01-15 VITALS — BP 120/78 | HR 88 | Temp 96.9°F | Ht 71.75 in | Wt 175.8 lb

## 2013-01-15 DIAGNOSIS — G4721 Circadian rhythm sleep disorder, delayed sleep phase type: Secondary | ICD-10-CM | POA: Insufficient documentation

## 2013-01-15 NOTE — Addendum Note (Signed)
Addended by: Barbaraann Share on: 01/15/2013 11:32 AM   Modules accepted: Level of Service

## 2013-01-15 NOTE — Patient Instructions (Addendum)
Try taking melatonin 3mg  about 3 hrs before bedtime (except the nights you know you will be up for school) Try making your "normal" bedtime about 12-1am. Get up every am between 7-9am no matter what time you go to bed. Absolutely no napping during the day under any circumstances. Exercise no closer than 4 hrs to bedtime. Please call me in 3 weeks to give some followup after trying the above.

## 2013-01-15 NOTE — Progress Notes (Signed)
Subjective:    Patient ID: Don [name], male    DOB: 12-29-1990, 22 y.o.   MRN: [id number]  HPI The patient is a 22 year old male who comes in today as a self-referral for evaluation of ongoing sleep issues.  He states for the last 5 years he has had difficulties staying on a normal sleep schedule.  He is a Archivist, and often he will either have to stay up most of the night for school, or accidentally he watches movies and doesn't realize where the time went.  His "normal schedule" consists of getting to bed sometime between 10 and midnight, and awakening around 6 AM.  He would rather get up at 9 AM.  The patient denies having any issues getting to sleep, and has no significant sleep disruption with periods of prolonged awakening.  He feels reasonably rested in the mornings upon arising, but complains that he begins to feel very tired and sleepy by the afternoon.  It is very difficult for him to stay awake for class or for quiet activities.  His "abnormal schedule" consists of getting to bed anytime between 3 and 6 AM, and getting up at 12 noon or later to start his day.  He feels that he sleeps better on this schedule, and does not have the typical sleepiness.  His problem is worsened whenever he goes back and forth between these 2 schedules.  The patient feels that he cannot go any longer than 2-3 weeks on a normal schedule without something causing him to go back to an abnormal schedule.  This can include schoolwork, staying up late for tv and computer.  He feels that his sleep is much improved whenever he exercises on a regular basis.  The patient denies any history that would suggest a disordered breathing or another sleep disorder.  His history is really not consistent with narcolepsy or idiopathic hypersomnia.   Sleep Questionnaire What time do you typically go to bed?( Between what hours) 12am 12am at 0953 on 01/15/13 by Nita Sells, CMA How long does it take you to fall asleep?   at 0953 on 01/15/13 by Nita Sells, CMA How many times during the night do you wake up? No Value several times at 0953 on 01/15/13 by Nita Sells, CMA What time do you get out of bed to start your day? [id number] 5-6a at 0953 on 01/15/13 by Nita Sells, CMA Do you drive or operate heavy machinery in your occupation? No No at 0953 on 01/15/13 by Nita Sells, CMA How much has your weight changed (up or down) over the past two years? (In pounds) 10 lb (4.536 kg)10 lb (4.536 kg) increase at 0953 on 01/15/13 by Nita Sells, CMA Have you ever had a sleep study before? No No at 0953 on 01/15/13 by Nita Sells, CMA Do you currently use CPAP? No No at 0953 on 01/15/13 by Nita Sells, CMA Do you wear oxygen at any time? No No at 0953 on 01/15/13 by Nita Sells, CMA   Review of Systems  Constitutional: Negative for fever and unexpected weight change.  HENT: Negative for ear pain, nosebleeds, congestion, sore throat, rhinorrhea, sneezing, trouble swallowing, dental problem, postnasal drip and sinus pressure.   Eyes: Negative for redness and itching.  Respiratory: Negative for cough, chest tightness, shortness of breath and wheezing.   Cardiovascular: Negative for palpitations and leg swelling.  Gastrointestinal: Negative for nausea and vomiting.  Genitourinary: Negative for  dysuria.  Musculoskeletal: Negative for joint swelling.  Skin: Negative for rash.  Neurological: Negative for headaches.  Hematological: Does not bruise/bleed easily.  Psychiatric/Behavioral: Negative for dysphoric mood. The patient is not nervous/anxious.        Objective:   Physical Exam Constitutional:  Well developed, no acute distress  HENT:  Nares patent without discharge  Oropharynx without exudate, palate and uvula are normal  Eyes:  Perrla, eomi, no scleral icterus  Neck:  No JVD, no TMG  Cardiovascular:  Normal rate, regular rhythm, no rubs or gallops.  No murmurs         Intact distal pulses  Pulmonary :  Normal breath sounds, no stridor or respiratory distress   No rales, rhonchi, or wheezing  Abdominal:  Soft, nondistended, bowel sounds present.  No tenderness noted.   Musculoskeletal:  No lower extremity edema noted.  Lymph Nodes:  No cervical lymphadenopathy noted  Skin:  No cyanosis noted  Neurologic:  Alert, appropriate, moves all 4 extremities without obvious deficit.         Assessment & Plan:

## 2013-01-15 NOTE — Assessment & Plan Note (Signed)
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  This is very common in high school and college students who keep an irregular sleep schedule, and can result in significant sleepiness during their time of awakening.  The patient denies having any issue with sleep onset or maintenance regardless of whether he goes to bed at the normal time or on his abnormal schedule.  His difficulty is with sleepiness during the day when he goes from his abnormal sleep schedule back to a normal sleep time.  I think the majority of this is self induced, and I have reviewed with him good sleep hygiene.  I have asked him to try melatonin about 3 hours before bedtime, and have discussed the importance of rising in the mornings in order to take advantage of early morning light to keep his circadian rhythm on schedule.  I also asked him to avoid any napping during the day, and to avoid exercising any closer than 4 hours to bedtime.  I would like to hear back from him in about 3 weeks, but I suspect this will be an ongoing issue as long as he is in college with irregular hours.

## 2013-12-02 ENCOUNTER — Ambulatory Visit (INDEPENDENT_AMBULATORY_CARE_PROVIDER_SITE_OTHER): Payer: BC Managed Care – PPO | Admitting: Physician Assistant

## 2013-12-02 VITALS — BP 118/70 | HR 57 | Temp 97.9°F | Resp 16 | Ht 71.0 in | Wt 160.8 lb

## 2013-12-02 DIAGNOSIS — IMO0002 Reserved for concepts with insufficient information to code with codable children: Secondary | ICD-10-CM

## 2013-12-02 DIAGNOSIS — Z23 Encounter for immunization: Secondary | ICD-10-CM

## 2013-12-02 DIAGNOSIS — Z7189 Other specified counseling: Secondary | ICD-10-CM

## 2013-12-02 DIAGNOSIS — Z2989 Encounter for other specified prophylactic measures: Secondary | ICD-10-CM

## 2013-12-02 DIAGNOSIS — Z298 Encounter for other specified prophylactic measures: Secondary | ICD-10-CM

## 2013-12-02 MED ORDER — TYPHOID VACCINE PO CPDR: 1.0000 | DELAYED_RELEASE_CAPSULE | ORAL | Status: AC

## 2013-12-02 MED ORDER — DEXAMETHASONE 4 MG PO TABS
4.0000 mg | ORAL_TABLET | Freq: Four times a day (QID) | ORAL | Status: AC
Start: 2013-12-02 — End: ?

## 2013-12-02 MED ORDER — ACETAZOLAMIDE 125 MG PO TABS: 125.0000 mg | ORAL_TABLET | Freq: Two times a day (BID) | ORAL | Status: AC

## 2013-12-02 MED ORDER — ATOVAQUONE-PROGUANIL HCL 250-100 MG PO TABS: 1.0000 | ORAL_TABLET | Freq: Every day | ORAL | Status: AC

## 2013-12-02 NOTE — Progress Notes (Signed)
   Subjective:    Patient ID: JAHKING SOHAL, male    DOB: 03/25/1991, 23 y.o.   MRN: [id number]  HPI   Mr. Underdown is a very pleasant 23 yr old male here for discussion of immunizations prior to travel.  He will be traveling to [place] on July 27 for 2 wks.  He will be climbing [place] while he is there.  Will be acclimating for 1 wk and then ascending for 1 wk.  His father is a physician and recommended both Diamox and Decadron for altitude sickness prevention.  Pt reports he has been as high as 12,000 ft in the past - lived in [place] for awhile.  He has never had trouble with altitude, but [place] is 19,065ft.  He is also interested in the Hep A and typhoid vaccines as well as malaria ppx  He feels well today and has never had an adverse reaction to a vaccine  Review of Systems  Constitutional: Negative for fever and chills.  Respiratory: Negative.   Cardiovascular: Negative.   Gastrointestinal: Negative.        Objective:   Physical Exam  Vitals reviewed. Constitutional: He is oriented to person, place, and time. He appears well-developed and well-nourished. No distress.  HENT:  Head: Normocephalic and atraumatic.  Eyes: Conjunctivae are normal. No scleral icterus.  Cardiovascular: Normal rate, regular rhythm and normal heart sounds.   Pulmonary/Chest: Effort normal and breath sounds normal. He has no wheezes. He has no rales.  Neurological: He is alert and oriented to person, place, and time.  Skin: Skin is warm and dry.  Psychiatric: He has a normal mood and affect. His behavior is normal.     Immunization History  Administered Date(s) Administered  . Hepatitis A, Adult 12/02/2013  . Tdap 06/13/2011       Assessment & Plan:  Advice or immunization for travel - Plan: typhoid (VIVOTIF) DR capsule, atovaquone-proguanil (MALARONE) 250-100 MG TABS, acetaZOLAMIDE (DIAMOX) 125 MG tablet, dexamethasone (DECADRON) 4 MG tablet  Need for prophylactic vaccination and  inoculation against viral hepatitis - Plan: Hepatitis A vaccine adult IM, typhoid (VIVOTIF) DR capsule  Need for immunization against typhoid - Plan: typhoid (VIVOTIF) DR capsule  Altitude sickness preventative measures - Plan: acetaZOLAMIDE (DIAMOX) 125 MG tablet, dexamethasone (DECADRON) 4 MG tablet   Mr. Waldock is a very pleasant 23 yr old male here for immunizations prior to travel.  We have administered Hep A today - he will need a booster in 6-12 months.  He has also been prescribed Vivotif to complete at least 1 full week prior to travel.  Start Malarone 1-2 days prior to travel, continue for 1 wk after he returns.  Start Diamox BID when traveling.  Can use Decadron if developing symptoms concerning for HACE or HAPE but do not want him using this prophylactically due to adverse effects.  We discussed that if he does develop symptoms concerning for severe altitude sickness he needs to begin descending right away - Decadron is NOT a substitute for descent.  I have printed information for him to review at home.  He understands and agrees  Pt to call or RTC if other concerns prior to travel  E. Frances Furbish MHS, PA-C [facility] 6/10/20152:24 PM

## 2013-12-02 NOTE — Patient Instructions (Signed)
(  1) We have given you the hepatitis A vaccine today - it is recommended that you get a booster in 6-12 months  (2)  Start the atovaquone-proguanil (Malarone) 1-2 days before you travel.  Take daily while you are in [place] and continue for 1 week after you return  (3)  Make sure you complete the typhoid vaccine (Vivotif) at least 1 week prior to travel.  Take one capsule every other day for 4 doses.  Your ampicillin may decrease the effectiveness of the vaccine, so I would consider holding that until you complete the vaccine  (4)  The most important steps in preventing altitude sickness are allowing time for acclimation and staying well hydrated.  Also take the acetazolamide (Diamox) twice daily while traveling  (5)  If you begin developing signs or symptoms of HAVE (high altitude cerebral edema) - like severe weakness, difficulty walking, confusion, etc - begin taking the dexamethasone (Decadron) 4mg  every 6 hours - but you must also begin descending the mountain.  There is NO replacement for descent in the treatment of severe altitude sickness
